# Patient Record
Sex: Female | Born: 2005 | Race: White | Hispanic: No | Marital: Single | Smoking: Never smoker
Health system: Southern US, Community
[De-identification: ages and names within clinical notes are randomized; demographics above are authoritative.]

## PROBLEM LIST (undated history)

## (undated) DIAGNOSIS — F419 Anxiety disorder, unspecified: Secondary | ICD-10-CM

## (undated) DIAGNOSIS — F909 Attention-deficit hyperactivity disorder, unspecified type: Secondary | ICD-10-CM

## (undated) DIAGNOSIS — J45909 Unspecified asthma, uncomplicated: Secondary | ICD-10-CM

## (undated) DIAGNOSIS — G43909 Migraine, unspecified, not intractable, without status migrainosus: Secondary | ICD-10-CM

## (undated) HISTORY — DX: Attention-deficit hyperactivity disorder, unspecified type: F90.9

## (undated) HISTORY — DX: Migraine, unspecified, not intractable, without status migrainosus: G43.909

## (undated) HISTORY — PX: NO PAST SURGERIES: SHX2092

## (undated) HISTORY — DX: Unspecified asthma, uncomplicated: J45.909

## (undated) HISTORY — DX: Anxiety disorder, unspecified: F41.9

---
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
## 2023-08-25 ENCOUNTER — Telehealth: Payer: Self-pay

## 2023-08-25 ENCOUNTER — Other Ambulatory Visit (HOSPITAL_COMMUNITY): Payer: Self-pay

## 2023-08-25 NOTE — Telephone Encounter (Signed)
 Pharmacy Patient Advocate Encounter   Received notification from CoverMyMeds that prior authorization for Ubrelvy  100MG  tablets is required/requested.   Insurance verification completed.   The patient is insured through [facility] .   Per test claim: The current 30 day co-pay is, $0.  No PA needed at this time. This test claim was processed through [facility]- copay amounts may vary at other pharmacies due to pharmacy/plan contracts, or as the patient moves through the different stages of their insurance plan.

## 2023-10-28 NOTE — Progress Notes (Unsigned)
 PATIENT: Wendy Fuentes DOB: May 15, 2005  REASON FOR VISIT: follow up HISTORY FROM: patient  Virtual Visit via MyChart video  I connected with Wendy Fuentes on 10/29/23 at  9:30 AM EDT via MyChart video and verified that I am speaking with the correct person using two identifiers.   I discussed the limitations, risks, security and privacy concerns of performing an evaluation and management service by Mychart video and the availability of in person appointments. I also discussed with the patient that there may be a patient responsible charge related to this service. The patient expressed understanding and agreed to proceed.   History of Present Illness:  10/29/23 ALL (MyChart): Wendy Fuentes is a 18 y.o. female here today for follow up for migraines. She was last seen by Dr Wendy Fuentes 10/2022 and doing well on Ubrelvy . Since, she reports migraines remain well managed. She may have 2-3 per month. She has occasional dizziness and dots in vision prior to headaches. Ubrelvy  works well. Not on birth control.   History (copied from Dr Wendy Fuentes previous note)  10/29/2022: She is getting 5 migraines a month and < 10 total headache days a month. Ubrelvy  really helps. Hapy with the ubrelvy . One ubrelvy  and takes an hour but normally takes one and in a few hours back to normal. Refill Ubrelvy .    HPI:  Wendy Fuentes is a 18 y.o. female here as requested by Wendy Rogue, MD for migraines. PMHx migraines, anxiety, exercise-induced asthma, inattention, adhd.     Patient here with her mother who also provides much information. Started at the age of 6, sh had bad headaches, photophobia, nausea, all the classic migraine symptoms. She had them on and off for years. Last 2 years she also have a lot of dizziness, vertigo, nausea, with and without the migraine head pain, she gets dots in her vision prior to the headache or without the headpain and can happen with the dizziness. Still have pulsating/pounding/throbbing  headaches, light and sound sensitivity, nausea, dizziness and vertigo and vision changes. Symptoms can last a few days. Imitrex acutely did not help. She has migraines 5-7 days of the month and several other other days where she may just have migraine aura. She had an MRi in 2021 that was normal. Tylenol helps a little. Artificial sugars can trigger, weather can trigger, not really associated with periods. Her mother is here and provides information. She went to endocronology. No other focal neurologic deficits, associated symptoms, inciting events or modifiable factors.   Reviewed notes, labs and imaging from outside physicians, which showed:   I reviewed notes from Dr. Nori, [facility] pediatricians, patient has a history of migraines, the pain can be located all over and described as pressure and achy, includes dizziness and nausea, has a history of vestibular migraines, she has nausea and dizziness as well.  She has been in the office for migraines lasting upwards of 4 days, in the past she is taken multiple doses of Imitrex, brief courses of prednisone and Zofran , mother asked for an adult neurologist, no more information provided in the notes from Dr. Nori.  I review of epic notes does not show any brain imaging.  I did review Care Everywhere and found notes from neurology Dr. Tinnie Fuentes [facility], last seen in August 2021, patient initially seen in May 2018, onset of headaches was 2014 in the third grade, they went away for a few years and returned in the fall 2017 with more severe headaches and became  daily, she saw a neurologist in [place] as well, during her last appointment she was reporting headaches once a week, dizziness and headaches were improved with chiropractor, 4-5 out of 10 pain lasting a few hours, finding ice helpful, making sure she eats helpful, changes in weather is a trigger, she tried Compazine which made her tired, MRI of the brain in  October 2021 was normal and reassuring.  Headaches are unilateral, frontal and temporal, but can also be bilateral, worst pain can be 10 out of 10 with associated nausea, dizziness sensitivity light and noise smell, changes in vision, ringing in ears and neck stiffness, throbbing pain with dizziness lightheadedness, nausea, photophobia phonophobia, 4-5 headache days per week.  She also tried Maxalt, propranolol, cyproheptadine.  They discussed medication overuse headaches and rebound headaches. she had tried Compazine, and then was given Imitrex, she was referred for mind-body therapy, she was also seeing a chiropractor per notes, they discussed the reveal, they also discussed miles per migraine education days 1 was coming up on mindful meditation.   From a thorough review of records medications tried that can be used in migraine management include: Imitrex, Compazine, Maxalt, cyproheptadine, propranolol (did not help, significant dizziness and nausea side effects), Lamictal, BuSpar, amitriptyline(side effects), topamax (side effects), imitrex, maxalt, zomig (had side effects to triptans)   I reviewed MRI of the brain report: November 09, 2019, no acute intracranial abnormality, unremarkable noncontrast brain MRI for patient's age.   Observations/Objective:  Generalized: Well developed, in no acute distress  Mentation: Alert oriented to time, place, history taking. Follows all commands speech and language fluent   Assessment and Plan:  18 y.o. year old female  has a past medical history of ADHD, Anxiety, Asthma, and Migraine. here with    ICD-10-CM   1. Migraine with aura and without status migrainosus, not intractable  G43.109 Ubrogepant  (UBRELVY ) 100 MG TABS     [name] is doing well. We will continue Ubrelvy  as needed. She may use ondansetron  for nausea as needed. Advised against use of estrogen based birth control. Healthy lifestyle habits encouraged. She will follow up with me in 1 year,  sooner if needed.    No orders of the defined types were placed in this encounter.   Meds ordered this encounter  Medications   Ubrogepant  (UBRELVY ) 100 MG TABS    Sig: Take 1 tablet (100 mg total) by mouth every 2 (two) hours as needed. Maximum 200mg  a day.    Dispense:  16 tablet    Refill:  11    Episodic migraines. 5 migraine days a month and < 10 total headache days a month. Failed imitrex, maxalt, zomig (side effects, triptans contraindicated)    Supervising Provider:   INES ONETHA NOVAK [[id number]]     Follow Up Instructions:  I discussed the assessment and treatment plan with the patient. The patient was provided an opportunity to ask questions and all were answered. The patient agreed with the plan and demonstrated an understanding of the instructions.   The patient was advised to call back or seek an in-person evaluation if the symptoms worsen or if the condition fails to improve as anticipated.  I provided 15 minutes of face-to-face and non face-to-face time during this MyChart video encounter. Patient located at their place of residence. Provider is in the office.    Sorin Frimpong, NP

## 2023-10-28 NOTE — Patient Instructions (Signed)
 Below is our plan:  We will continue Ubrelvy  as needed. I advise against use of Ubrelvy  during pregnancy and use of estrogen based products with migraine aura.   I recommend against the use of estrogen based birth control in women with migriane with aura due to increased risks of stroke.   Please make sure you are staying well hydrated. I recommend 50-60 ounces daily. Well balanced diet and regular exercise encouraged. Consistent sleep schedule with 6-8 hours recommended.   Please continue follow up with care team as directed.   Follow up with me in 1 year   You may receive a survey regarding today's visit. I encourage you to leave honest feed back as I do use this information to improve patient care. Thank you for seeing me today!   GENERAL HEADACHE INFORMATION:   Natural supplements: Magnesium Oxide or Magnesium Glycinate 500 mg at bed (up to 800 mg daily) Coenzyme Q10 300 mg in AM Vitamin B2- 200 mg twice a day   Add 1 supplement at a time since even natural supplements can have undesirable side effects. You can sometimes buy supplements cheaper (especially Coenzyme Q10) at www.WebmailGuide.co.za or at [facility].  Migraine with aura: There is increased risk for stroke in women with migraine with aura and a contraindication for the combined contraceptive pill for use by women who have migraine with aura. The risk for women with migraine without aura is lower. However other risk factors like smoking are far more likely to increase stroke risk than migraine. There is a recommendation for no smoking and for the use of OCPs without estrogen such as progestogen only pills particularly for women with migraine with aura.SABRA People who have migraine headaches with auras may be 3 times more likely to have a stroke caused by a blood clot, compared to migraine patients who don't see auras. Women who take hormone-replacement therapy may be 30 percent more likely to suffer a clot-based stroke than women not taking  medication containing estrogen. Other risk factors like smoking and high blood pressure may be  much more important.    Vitamins and herbs that show potential:   Magnesium: Magnesium (250 mg twice a day or 500 mg at bed) has a relaxant effect on smooth muscles such as blood vessels. Individuals suffering from frequent or daily headache usually have low magnesium levels which can be increase with daily supplementation of 400-750 mg. Three trials found 40-90% average headache reduction  when used as a preventative. Magnesium may help with headaches are aura, the best evidence for magnesium is for migraine with aura is its thought to stop the cortical spreading depression we believe is the pathophysiology of migraine aura.Magnesium also demonstrated the benefit in menstrually related migraine.  Magnesium is part of the messenger system in the serotonin cascade and it is a good muscle relaxant.  It is also useful for constipation which can be a side effect of other medications used to treat migraine. Good sources include nuts, whole grains, and tomatoes. Side Effects: loose stool/diarrhea  Riboflavin (vitamin B 2) 200 mg twice a day. This vitamin assists nerve cells in the production of ATP a principal energy storing molecule.  It is necessary for many chemical reactions in the body.  There have been at least 3 clinical trials of riboflavin using 400 mg per day all of which suggested that migraine frequency can be decreased.  All 3 trials showed significant improvement in over half of migraine sufferers.  The supplement is found in  bread, cereal, milk, meat, and poultry.  Most Americans get more riboflavin than the recommended daily allowance, however riboflavin deficiency is not necessary for the supplements to help prevent headache. Side effects: energizing, green urine   Coenzyme Q10: This is present in almost all cells in the body and is critical component for the conversion of energy.  Recent studies have  shown that a nutritional supplement of CoQ10 can reduce the frequency of migraine attacks by improving the energy production of cells as with riboflavin.  Doses of 150 mg twice a day have been shown to be effective.   Melatonin: Increasing evidence shows correlation between melatonin secretion and headache conditions.  Melatonin supplementation has decreased headache intensity and duration.  It is widely used as a sleep aid.  Sleep is natures way of dealing with migraine.  A dose of 3 mg is recommended to start for headaches including cluster headache. Higher doses up to 15 mg has been reviewed for use in Cluster headache and have been used. The rationale behind using melatonin for cluster is that many theories regarding the cause of Cluster headache center around the disruption of the normal circadian rhythm in the brain.  This helps restore the normal circadian rhythm.   HEADACHE DIET: Foods and beverages which may trigger migraine Note that only 20% of headache patients are food sensitive. You will know if you are food sensitive if you get a headache consistently 20 minutes to 2 hours after eating a certain food. Only cut out a food if it causes headaches, otherwise you might remove foods you enjoy! What matters most for diet is to eat a well balanced healthy diet full of vegetables and low fat protein, and to not miss meals.   Chocolate, other sweets ALL cheeses except cottage and cream cheese Dairy products, yogurt, sour cream, ice cream Liver Meat extracts (Bovril, Marmite, meat tenderizers) Meats or fish which have undergone aging, fermenting, pickling or smoking. These include: Hotdogs,salami,Lox,sausage, mortadellas,smoked salmon, pepperoni, Pickled herring Pods of broad bean (English beans, Chinese pea pods, [place] (fava) beans, lima and navy beans Ripe avocado, ripe banana Yeast extracts or active yeast preparations such as Brewer's or Fleishman's (commercial bakes goods are  permitted) Tomato based foods, pizza (lasagna, etc.)   MSG (monosodium glutamate) is disguised as many things; look for these common aliases: Monopotassium glutamate Autolysed yeast Hydrolysed protein Sodium caseinate "flavorings" "all natural preservatives Nutrasweet   Avoid all other foods that convincingly provoke headaches.   Resources: The Dizzy Bluford Aid Your Headache Diet, migrainestrong.com  https://zamora-andrews.com/   Caffeine and Migraine For patients that have migraine, caffeine intake more than 3 days per week can lead to dependency and increased migraine frequency. I would recommend cutting back on your caffeine intake as best you can. The recommended amount of caffeine is 200-300 mg daily, although migraine patients may experience dependency at even lower doses. While you may notice an increase in headache temporarily, cutting back will be helpful for headaches in the long run. For more information on caffeine and migraine, visit: https://americanmigrainefoundation.org/resource-library/caffeine-and-migraine/   Headache Prevention Strategies:   1. Maintain a headache diary; learn to identify and avoid triggers.  - This can be a simple note where you log when you had a headache, associated symptoms, and medications used - There are several smartphone apps developed to help track migraines: Migraine Buddy, Migraine Monitor, Curelator N1-Headache App   Common triggers include: Emotional triggers: Emotional/Upset family or friends Emotional/Upset occupation Business reversal/success Anticipation anxiety Crisis-serious Post-crisis periodNew  job/position   Physical triggers: Vacation Day Weekend Strenuous Exercise High Altitude Location New Move Menstrual Day Physical Illness Oversleep/Not enough sleep Weather changes Light: Photophobia or light sesnitivity treatment involves a balance between desensitization and  reduction in overly strong input. Use dark polarized glasses outside, but not inside. Avoid bright or fluorescent light, but do not dim environment to the point that going into a normally lit room hurts. Consider FL-41 tint lenses, which reduce the most irritating wavelengths without blocking too much light.  These can be obtained at axonoptics.com or theraspecs.com Foods: see list above.   2. Limit use of acute treatments (over-the-counter medications, triptans, etc.) to no more than 2 days per week or 10 days per month to prevent medication overuse headache (rebound headache).     3. Follow a regular schedule (including weekends and holidays): Don't skip meals. Eat a balanced diet. 8 hours of sleep nightly. Minimize stress. Exercise 30 minutes per day. Being overweight is associated with a 5 times increased risk of chronic migraine. Keep well hydrated and drink 6-8 glasses of water per day.   4. Initiate non-pharmacologic measures at the earliest onset of your headache. Rest and quiet environment. Relax and reduce stress. Breathe2Relax is a free app that can instruct you on    some simple relaxtion and breathing techniques. Http://Dawnbuse.com is a    free website that provides teaching videos on relaxation.  Also, there are  many apps that   can be downloaded for "mindful" relaxation.  An app called YOGA NIDRA will help walk you through mindfulness. Another app called Calm can be downloaded to give you a structured mindfulness guide with daily reminders and skill development. Headspace for guided meditation Mindfulness Based Stress Reduction Online Course: www.palousemindfulness.com Cold compresses.   5. Don't wait!! Take the maximum allowable dosage of prescribed medication at the first sign of migraine.   6. Compliance:  Take prescribed medication regularly as directed and at the first sign of a migraine.   7. Communicate:  Call your physician when problems arise, especially if your  headaches change, increase in frequency/severity, or become associated with neurological symptoms (weakness, numbness, slurred speech, etc.). Proceed to emergency room if you experience new or worsening symptoms or symptoms do not resolve, if you have new neurologic symptoms or if headache is severe, or for any concerning symptom.   8. Headache/pain management therapies: Consider various complementary methods, including medication, behavioral therapy, psychological counselling, biofeedback, massage therapy, acupuncture, dry needling, and other modalities.  Such measures may reduce the need for medications. Counseling for pain management, where patients learn to function and ignore/minimize their pain, seems to work very well.   9. Recommend changing family's attention and focus away from patient's headaches. Instead, emphasize daily activities. If first question of day is 'How are your headaches/Do you have a headache today?', then patient will constantly think about headaches, thus making them worse. Goal is to re-direct attention away from headaches, toward daily activities and other distractions.   10. Helpful Websites: www.AmericanHeadacheSociety.org PatentHood.ch www.headaches.org TightMarket.nl www.achenet.org

## 2023-10-29 ENCOUNTER — Encounter: Payer: Self-pay | Admitting: Family Medicine

## 2023-10-29 ENCOUNTER — Telehealth: Payer: BC Managed Care – PPO | Admitting: Family Medicine

## 2023-10-29 DIAGNOSIS — G43109 Migraine with aura, not intractable, without status migrainosus: Secondary | ICD-10-CM

## 2023-10-29 MED ORDER — UBRELVY 100 MG PO TABS: 100.0000 mg | ORAL_TABLET | ORAL | 11 refills | Status: AC | PRN

## 2024-11-02 ENCOUNTER — Telehealth: Admitting: Family Medicine
